# Patient Record
Sex: Male | Born: 1965 | Race: White | Hispanic: No | Marital: Single | Smoking: Never smoker
Health system: Southern US, Community
[De-identification: ages and names within clinical notes are randomized; demographics above are authoritative.]

## PROBLEM LIST (undated history)

## (undated) DIAGNOSIS — I10 Essential (primary) hypertension: Secondary | ICD-10-CM

## (undated) HISTORY — PX: FACIAL FRACTURE SURGERY: SHX1570

## (undated) HISTORY — PX: OTHER SURGICAL HISTORY: SHX169

## (undated) HISTORY — PX: HERNIA REPAIR: SHX51

---

## 2018-07-13 ENCOUNTER — Other Ambulatory Visit: Payer: Self-pay

## 2018-07-13 ENCOUNTER — Emergency Department: Payer: Self-pay

## 2018-07-13 ENCOUNTER — Encounter: Payer: Self-pay | Admitting: Emergency Medicine

## 2018-07-13 ENCOUNTER — Inpatient Hospital Stay
Admission: EM | Admit: 2018-07-13 | Discharge: 2018-07-16 | DRG: 872 | Disposition: A | Discharge status: Home / Self Care | Payer: Self-pay | Attending: Internal Medicine | Admitting: Internal Medicine

## 2018-07-13 DIAGNOSIS — Z981 Arthrodesis status: Secondary | ICD-10-CM

## 2018-07-13 DIAGNOSIS — A419 Sepsis, unspecified organism: Principal | ICD-10-CM

## 2018-07-13 DIAGNOSIS — K219 Gastro-esophageal reflux disease without esophagitis: Secondary | ICD-10-CM | POA: Diagnosis present

## 2018-07-13 DIAGNOSIS — J209 Acute bronchitis, unspecified: Secondary | ICD-10-CM | POA: Diagnosis present

## 2018-07-13 DIAGNOSIS — I10 Essential (primary) hypertension: Secondary | ICD-10-CM | POA: Diagnosis present

## 2018-07-13 DIAGNOSIS — T368X5A Adverse effect of other systemic antibiotics, initial encounter: Secondary | ICD-10-CM | POA: Diagnosis not present

## 2018-07-13 DIAGNOSIS — L509 Urticaria, unspecified: Secondary | ICD-10-CM | POA: Diagnosis not present

## 2018-07-13 DIAGNOSIS — Z79899 Other long term (current) drug therapy: Secondary | ICD-10-CM

## 2018-07-13 DIAGNOSIS — K5792 Diverticulitis of intestine, part unspecified, without perforation or abscess without bleeding: Secondary | ICD-10-CM | POA: Diagnosis present

## 2018-07-13 DIAGNOSIS — Z880 Allergy status to penicillin: Secondary | ICD-10-CM

## 2018-07-13 DIAGNOSIS — Z881 Allergy status to other antibiotic agents status: Secondary | ICD-10-CM

## 2018-07-13 HISTORY — DX: Essential (primary) hypertension: I10

## 2018-07-13 LAB — URINALYSIS, COMPLETE (UACMP) WITH MICROSCOPIC
BACTERIA UA: NONE SEEN
Bilirubin Urine: NEGATIVE
Glucose, UA: NEGATIVE mg/dL
Hgb urine dipstick: NEGATIVE
KETONES UR: NEGATIVE mg/dL
LEUKOCYTES UA: NEGATIVE
Nitrite: NEGATIVE
PH: 7 (ref 5.0–8.0)
Protein, ur: 100 mg/dL — AB
SPECIFIC GRAVITY, URINE: 1.02 (ref 1.005–1.030)

## 2018-07-13 LAB — CBC
HEMATOCRIT: 33.5 % — AB (ref 40.0–52.0)
HEMOGLOBIN: 11.1 g/dL — AB (ref 13.0–18.0)
MCH: 25.4 pg — AB (ref 26.0–34.0)
MCHC: 33.1 g/dL (ref 32.0–36.0)
MCV: 76.7 fL — AB (ref 80.0–100.0)
Platelets: 306 10*3/uL (ref 150–440)
RBC: 4.36 MIL/uL — AB (ref 4.40–5.90)
RDW: 20.9 % — ABNORMAL HIGH (ref 11.5–14.5)
WBC: 9 10*3/uL (ref 3.8–10.6)

## 2018-07-13 LAB — BASIC METABOLIC PANEL
ANION GAP: 9 (ref 5–15)
BUN: 18 mg/dL (ref 6–20)
CHLORIDE: 102 mmol/L (ref 98–111)
CO2: 25 mmol/L (ref 22–32)
Calcium: 9.1 mg/dL (ref 8.9–10.3)
Creatinine, Ser: 1.25 mg/dL — ABNORMAL HIGH (ref 0.61–1.24)
GFR calc non Af Amer: >60 mL/min (ref >60)
GLUCOSE: 126 mg/dL — AB (ref 70–99)
POTASSIUM: 3.5 mmol/L (ref 3.5–5.1)
Sodium: 136 mmol/L (ref 135–145)

## 2018-07-13 LAB — HEPATIC FUNCTION PANEL
ALBUMIN: 4.5 g/dL (ref 3.5–5.0)
ALT: 24 U/L (ref 0–44)
AST: 31 U/L (ref 15–41)
Alkaline Phosphatase: 58 U/L (ref 38–126)
BILIRUBIN TOTAL: 0.6 mg/dL (ref 0.3–1.2)
Bilirubin, Direct: <0.1 mg/dL (ref 0.0–0.2)
Total Protein: 7.6 g/dL (ref 6.5–8.1)

## 2018-07-13 LAB — LACTIC ACID, PLASMA: Lactic Acid, Venous: 1.3 mmol/L (ref 0.5–1.9)

## 2018-07-13 LAB — TROPONIN I: Troponin I: <0.03 ng/mL (ref <0.03)

## 2018-07-13 LAB — LIPASE, BLOOD: Lipase: 23 U/L (ref 11–51)

## 2018-07-13 MED ORDER — MORPHINE SULFATE (PF) 2 MG/ML IV SOLN
2.0000 mg | Freq: Once | INTRAVENOUS | Status: AC
  Administered 2018-07-13: 2 mg via INTRAVENOUS
  Filled 2018-07-13: qty 1

## 2018-07-13 MED ORDER — SODIUM CHLORIDE 0.9 % IV BOLUS
1000.0000 mL | Freq: Once | INTRAVENOUS | Status: AC
  Administered 2018-07-13: 1000 mL via INTRAVENOUS

## 2018-07-13 MED ORDER — ONDANSETRON HCL 4 MG/2ML IJ SOLN
4.0000 mg | Freq: Once | INTRAMUSCULAR | Status: AC
  Administered 2018-07-13: 4 mg via INTRAVENOUS
  Filled 2018-07-13: qty 2

## 2018-07-13 MED ORDER — IOPAMIDOL (ISOVUE-300) INJECTION 61%
30.0000 mL | Freq: Once | INTRAVENOUS | Status: AC
  Administered 2018-07-13: 30 mL via ORAL

## 2018-07-13 MED ORDER — SODIUM CHLORIDE 0.9 % IV BOLUS
500.0000 mL | Freq: Once | INTRAVENOUS | Status: AC
  Administered 2018-07-14: 500 mL via INTRAVENOUS

## 2018-07-13 MED ORDER — LEVOFLOXACIN IN D5W 750 MG/150ML IV SOLN
750.0000 mg | Freq: Once | INTRAVENOUS | Status: AC
  Administered 2018-07-13: 750 mg via INTRAVENOUS
  Filled 2018-07-13: qty 150

## 2018-07-13 MED ORDER — IOHEXOL 300 MG/ML  SOLN
100.0000 mL | Freq: Once | INTRAMUSCULAR | Status: AC | PRN
  Administered 2018-07-13: 100 mL via INTRAVENOUS

## 2018-07-13 MED ORDER — METRONIDAZOLE IN NACL 5-0.79 MG/ML-% IV SOLN: 500.0000 mg | Freq: Once | INTRAVENOUS | Status: DC

## 2018-07-13 NOTE — ED Notes (Signed)
Patient taken to CT scan.

## 2018-07-13 NOTE — H&P (Signed)
[facility] [place] NAME: Keith Goodman    MR#:  [id number]  DATE OF BIRTH:  10-11-1966  DATE OF ADMISSION:  07/13/2018  PRIMARY CARE PHYSICIAN: System, Pcp Not In   REQUESTING/REFERRING PHYSICIAN: Jacqualine Code, MD  CHIEF COMPLAINT:   Chief Complaint  Patient presents with  . Chest Pain  . Abdominal Pain    HISTORY OF PRESENT ILLNESS:  Keith Goodman  is a 52 y.o. male who presents with abdominal pain, fever and chills.  Work-up in the ED with CT imaging shows diverticulitis.  He denies any nausea or vomiting, or diarrhea.  Patient met sepsis criteria.  Hospitalist were called for admission  PAST MEDICAL HISTORY:   Past Medical History:  Diagnosis Date  . Hypertension      PAST SURGICAL HISTORY:   Past Surgical History:  Procedure Laterality Date  . FACIAL FRACTURE SURGERY    . HERNIA REPAIR    . neck fusion       SOCIAL HISTORY:   Social History   Tobacco Use  . Smoking status: Never Smoker  . Smokeless tobacco: Never Used  Substance Use Topics  . Alcohol use: Yes    Comment: occasionally     FAMILY HISTORY:  Family history reviewed and is non-contributory   DRUG ALLERGIES:   Allergies  Allergen Reactions  . Penicillins Palpitations    Has patient had a PCN reaction causing immediate rash, facial/tongue/throat swelling, SOB or lightheadedness with hypotension: Yes Has patient had a PCN reaction causing severe rash involving mucus membranes or skin necrosis: No Has patient had a PCN reaction that required hospitalization: No Has patient had a PCN reaction occurring within the last 10 years: Yes If all of the above answers are "NO", then may proceed with Cephalosporin use.    MEDICATIONS AT HOME:   Prior to Admission medications   Medication Sig Start Date End Date Taking? Authorizing Provider  acidophilus (RISAQUAD) CAPS capsule Take 1 capsule by mouth daily.   Yes [provider]   amLODipine (NORVASC) 2.5 MG tablet Take 2.5 mg by mouth daily.   Yes [provider]  fexofenadine (ALLEGRA) 180 MG tablet Take 180 mg by mouth daily.   Yes [provider]  GUAIFENESIN 1200 PO Take 1 tablet by mouth daily.   Yes [provider]  lansoprazole (PREVACID) 15 MG capsule Take 15 mg by mouth daily at 12 noon.   Yes [provider]  PARoxetine (PAXIL) 40 MG tablet Take 40 mg by mouth every morning.   Yes [provider]  ranitidine (ZANTAC) 150 MG tablet Take 150 mg by mouth 2 (two) times daily.   Yes [provider]    REVIEW OF SYSTEMS:  Review of Systems  Constitutional: Positive for chills and fever. Negative for malaise/fatigue and weight loss.  HENT: Negative for ear pain, hearing loss and tinnitus.   Eyes: Negative for blurred vision, double vision, pain and redness.  Respiratory: Negative for cough, hemoptysis and shortness of breath.   Cardiovascular: Negative for chest pain, palpitations, orthopnea and leg swelling.  Gastrointestinal: Positive for abdominal pain. Negative for constipation, diarrhea, nausea and vomiting.  Genitourinary: Negative for dysuria, frequency and hematuria.  Musculoskeletal: Negative for back pain, joint pain and neck pain.  Skin:       No acne, rash, or lesions  Neurological: Negative for dizziness, tremors, focal weakness and weakness.  Endo/Heme/Allergies: Negative for polydipsia. Does not bruise/bleed easily.  Psychiatric/Behavioral: Negative for  depression. The patient is not nervous/anxious and does not have insomnia.      VITAL SIGNS:   Vitals:   07/13/18 2114 07/13/18 2118 07/13/18 2232  BP:  (!) 157/96 (!) 154/96  Pulse:  (!) 124 95  Resp:  18 18  Temp:  100.3 F (37.9 C) 99.9 F (37.7 C)  TempSrc:  Oral Oral  SpO2:  97% 100%  Weight: 90.7 kg (200 lb)    Height: 5' 7"  (1.702 m)     Wt Readings from Last 3 Encounters:  07/13/18 90.7 kg (200 lb)    PHYSICAL  EXAMINATION:  Physical Exam  Vitals reviewed. Constitutional: He is oriented to person, place, and time. He appears well-developed and well-nourished. No distress.  HENT:  Head: Normocephalic and atraumatic.  Mouth/Throat: Oropharynx is clear and moist.  Eyes: Pupils are equal, round, and reactive to light. Conjunctivae and EOM are normal. No scleral icterus.  Neck: Normal range of motion. Neck supple. No JVD present. No thyromegaly present.  Cardiovascular: Normal rate, regular rhythm and intact distal pulses. Exam reveals no gallop and no friction rub.  No murmur heard. Respiratory: Effort normal and breath sounds normal. No respiratory distress. He has no wheezes. He has no rales.  GI: Soft. Bowel sounds are normal. He exhibits no distension. There is tenderness.  Musculoskeletal: Normal range of motion. He exhibits no edema.  No arthritis, no gout  Lymphadenopathy:    He has no cervical adenopathy.  Neurological: He is alert and oriented to person, place, and time. No cranial nerve deficit.  No dysarthria, no aphasia  Skin: Skin is warm and dry. No rash noted. No erythema.  Psychiatric: He has a normal mood and affect. His behavior is normal. Judgment and thought content normal.    LABORATORY PANEL:   CBC Recent Labs  Lab 07/13/18 2135  WBC 9.0  HGB 11.1*  HCT 33.5*  PLT 306   ------------------------------------------------------------------------------------------------------------------  Chemistries  Recent Labs  Lab 07/13/18 2135  NA 136  K 3.5  CL 102  CO2 25  GLUCOSE 126*  BUN 18  CREATININE 1.25*  CALCIUM 9.1  AST 31  ALT 24  ALKPHOS 58  BILITOT 0.6   ------------------------------------------------------------------------------------------------------------------  Cardiac Enzymes Recent Labs  Lab 07/13/18 2135  TROPONINI <0.03    ------------------------------------------------------------------------------------------------------------------  RADIOLOGY:  Dg Chest 2 View  Result Date: 07/13/2018 CLINICAL DATA:  Chest pain EXAM: CHEST - 2 VIEW COMPARISON:  None. FINDINGS: The heart size and mediastinal contours are within normal limits. Both lungs are clear. The visualized skeletal structures are unremarkable. IMPRESSION: No active cardiopulmonary disease. Electronically Signed   By: Ulyses Jarred M.D.   On: 07/13/2018 22:54   Ct Abdomen Pelvis W Contrast  Result Date: 07/13/2018 CLINICAL DATA:  Left upper quadrant and chest pain radiating to the back since Saturday morning. History of inguinal hernia repair. EXAM: CT ABDOMEN AND PELVIS WITH CONTRAST TECHNIQUE: Multidetector CT imaging of the abdomen and pelvis was performed using the standard protocol following bolus administration of intravenous contrast. CONTRAST:  11m OMNIPAQUE IOHEXOL 300 MG/ML  SOLN COMPARISON:  None. FINDINGS: Lower chest: No acute abnormality. Hepatobiliary: A few scattered subcentimeter hypodensities are noted within the liver too small to further characterize but statistically consistent with cysts or hemangiomata. No biliary dilatation or enhancing lesion. Gallbladder is physiologically distended. No calculi are noted within. Pancreas: No pancreatic inflammation, mass or ductal dilatation. Mild fatty atrophy. Spleen: No splenomegaly or mass. Adrenals/Urinary Tract: Normal bilateral adrenal glands. Nonobstructing calculi in  the upper pole of the right kidney measuring up to 3 mm. No enhancing mass lesions of either kidney. No hydroureteronephrosis nor ureteral calculi. The urinary bladder is unremarkable for the degree of distention. Stomach/Bowel: Short segmental mural thickening and pericolonic inflammation along the mid transverse colon. Findings could represent a mild diverticulitis or focal colitis. Interval follow-up after appropriate therapy is  recommended to assure resolution and to exclude other etiologies including neoplasm. No bowel obstruction or inflammation. Contrast distended stomach without focal mural thickening. Normal appendix. Vascular/Lymphatic: Mild aortoiliac atherosclerosis.  No adenopathy. Reproductive: Normal size prostate and seminal vesicles. Other: Herniorrhaphy change bilaterally. No recurrent hernia identified. 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 IMPRESSION: 1. Short segmental mural thickening of the mid transverse colon with pericolonic inflammation. Findings likely reflect stigmata of uncomplicated diverticulitis or short segment colitis. Follow-up to assure resolution is recommended. 2. A few scattered subcentimeter hypodensities are noted within the liver too small to further characterize but statistically consistent with cysts or hemangiomata. 3. 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 Electronically Signed   By: Ashley Royalty M.D.   On: 07/13/2018 23:17    EKG:   Orders placed or performed during the hospital encounter of 07/13/18  . EKG 12-Lead  . EKG 12-Lead  . ED EKG within 10 minutes  . ED EKG within 10 minutes    IMPRESSION AND PLAN:  Principal Problem:   Sepsis ([place]) -IV antibiotics, lactic acid within normal limits, blood pressure stable, cultures sent, will switch patient to meropenem as he had hives in reaction to levofloxacin Active Problems:   Diverticulitis -source of sepsis, treatment as above   GERD (gastroesophageal reflux disease) -home dose PPI   HTN (hypertension) -home dose antihypertensive  Chart review performed and case discussed with ED provider. Labs, imaging and/or ECG reviewed by provider and discussed with patient/family. Management plans discussed with the patient and/or family.  DVT PROPHYLAXIS: SubQ lovenox  GI PROPHYLAXIS: PPI  ADMISSION STATUS: Inpatient  CODE STATUS: Full  TOTAL TIME  TAKING CARE OF THIS PATIENT: 45 minutes.   Jahne Krukowski [place] 07/13/2018, 11:56 PM  [facility]  Office  [phone]  CC: Primary care physician; System, Pcp Not In  Note:  This document was prepared using Dragon voice recognition software and may include unintentional dictation errors.

## 2018-07-13 NOTE — ED Triage Notes (Signed)
Pt arrives ambulatory to triage with c/o LUQ pain and chest pain which radiates around to his back. [place] and has been experiencing pain since Saturday morning. Pt is clearly uncomfortable at this time.

## 2018-07-14 ENCOUNTER — Other Ambulatory Visit: Payer: Self-pay

## 2018-07-14 LAB — BASIC METABOLIC PANEL
Anion gap: 7 (ref 5–15)
BUN: 11 mg/dL (ref 6–20)
CALCIUM: 8.5 mg/dL — AB (ref 8.9–10.3)
CO2: 25 mmol/L (ref 22–32)
CREATININE: 1.05 mg/dL (ref 0.61–1.24)
Chloride: 105 mmol/L (ref 98–111)
GLUCOSE: 120 mg/dL — AB (ref 70–99)
Potassium: 3.7 mmol/L (ref 3.5–5.1)
SODIUM: 137 mmol/L (ref 135–145)

## 2018-07-14 LAB — CBC
HCT: 31.5 % — ABNORMAL LOW (ref 40.0–52.0)
Hemoglobin: 10.4 g/dL — ABNORMAL LOW (ref 13.0–18.0)
MCH: 25.4 pg — AB (ref 26.0–34.0)
MCHC: 32.9 g/dL (ref 32.0–36.0)
MCV: 77.1 fL — ABNORMAL LOW (ref 80.0–100.0)
PLATELETS: 253 10*3/uL (ref 150–440)
RBC: 4.08 MIL/uL — ABNORMAL LOW (ref 4.40–5.90)
RDW: 20.7 % — AB (ref 11.5–14.5)
WBC: 8.6 10*3/uL (ref 3.8–10.6)

## 2018-07-14 MED ORDER — ONDANSETRON HCL 4 MG PO TABS
4.0000 mg | ORAL_TABLET | Freq: Four times a day (QID) | ORAL | Status: DC | PRN
  Administered 2018-07-14: 03:00:00 4 mg via ORAL
  Filled 2018-07-14: qty 1

## 2018-07-14 MED ORDER — DOCUSATE SODIUM 100 MG PO CAPS: 100.0000 mg | ORAL_CAPSULE | Freq: Two times a day (BID) | ORAL | Status: DC | PRN

## 2018-07-14 MED ORDER — PAROXETINE HCL 20 MG PO TABS
40.0000 mg | ORAL_TABLET | ORAL | Status: DC
  Administered 2018-07-14 – 2018-07-16 (×3): 40 mg via ORAL
  Filled 2018-07-14 (×4): qty 2

## 2018-07-14 MED ORDER — AMLODIPINE BESYLATE 5 MG PO TABS
2.5000 mg | ORAL_TABLET | Freq: Every day | ORAL | Status: DC
  Administered 2018-07-14 – 2018-07-15 (×2): 2.5 mg via ORAL
  Filled 2018-07-14 (×2): qty 1

## 2018-07-14 MED ORDER — DIPHENHYDRAMINE HCL 50 MG/ML IJ SOLN
25.0000 mg | Freq: Once | INTRAMUSCULAR | Status: AC
  Administered 2018-07-14: 02:00:00 25 mg via INTRAVENOUS
  Filled 2018-07-14: qty 0.5

## 2018-07-14 MED ORDER — METRONIDAZOLE IN NACL 5-0.79 MG/ML-% IV SOLN
500.0000 mg | Freq: Three times a day (TID) | INTRAVENOUS | Status: DC
  Administered 2018-07-14 – 2018-07-16 (×6): 500 mg via INTRAVENOUS
  Filled 2018-07-14 (×7): qty 100

## 2018-07-14 MED ORDER — SODIUM CHLORIDE 0.9 % IV SOLN
1.0000 g | Freq: Three times a day (TID) | INTRAVENOUS | Status: DC
  Filled 2018-07-14 (×2): qty 1

## 2018-07-14 MED ORDER — ENOXAPARIN SODIUM 40 MG/0.4ML ~~LOC~~ SOLN
40.0000 mg | SUBCUTANEOUS | Status: DC
  Administered 2018-07-14 – 2018-07-15 (×2): 40 mg via SUBCUTANEOUS
  Filled 2018-07-14 (×3): qty 0.4

## 2018-07-14 MED ORDER — ONDANSETRON HCL 4 MG/2ML IJ SOLN: 4.0000 mg | Freq: Four times a day (QID) | INTRAMUSCULAR | Status: DC | PRN

## 2018-07-14 MED ORDER — MORPHINE SULFATE (PF) 4 MG/ML IV SOLN
4.0000 mg | Freq: Once | INTRAVENOUS | Status: AC
  Administered 2018-07-14: 04:00:00 4 mg via INTRAVENOUS
  Filled 2018-07-14: qty 1

## 2018-07-14 MED ORDER — PANTOPRAZOLE SODIUM 20 MG PO TBEC
20.0000 mg | DELAYED_RELEASE_TABLET | Freq: Every day | ORAL | Status: DC
  Administered 2018-07-14 – 2018-07-16 (×3): 20 mg via ORAL
  Filled 2018-07-14 (×3): qty 1

## 2018-07-14 MED ORDER — OXYCODONE-ACETAMINOPHEN 5-325 MG PO TABS
1.0000 | ORAL_TABLET | Freq: Four times a day (QID) | ORAL | Status: DC | PRN
  Administered 2018-07-15: 1 via ORAL
  Filled 2018-07-14: qty 1

## 2018-07-14 MED ORDER — FAMOTIDINE 20 MG PO TABS
20.0000 mg | ORAL_TABLET | Freq: Two times a day (BID) | ORAL | Status: DC
  Administered 2018-07-14 – 2018-07-16 (×5): 20 mg via ORAL
  Filled 2018-07-14 (×5): qty 1

## 2018-07-14 MED ORDER — ACETAMINOPHEN 325 MG PO TABS
650.0000 mg | ORAL_TABLET | Freq: Four times a day (QID) | ORAL | Status: DC | PRN
  Administered 2018-07-14: 12:00:00 650 mg via ORAL
  Filled 2018-07-14 (×3): qty 2

## 2018-07-14 MED ORDER — MELATONIN 5 MG PO TABS
10.0000 mg | ORAL_TABLET | Freq: Every day | ORAL | Status: DC
  Administered 2018-07-14 – 2018-07-15 (×2): 10 mg via ORAL
  Filled 2018-07-14 (×4): qty 2

## 2018-07-14 MED ORDER — SODIUM CHLORIDE 0.9 % IV SOLN
2.0000 g | INTRAVENOUS | Status: DC
  Administered 2018-07-14 – 2018-07-15 (×2): 2 g via INTRAVENOUS
  Filled 2018-07-14 (×2): qty 2
  Filled 2018-07-14: qty 20

## 2018-07-14 MED ORDER — ACETAMINOPHEN 650 MG RE SUPP: 650.0000 mg | Freq: Four times a day (QID) | RECTAL | Status: DC | PRN

## 2018-07-14 MED ORDER — SODIUM CHLORIDE 0.9 % IV SOLN
1.0000 g | Freq: Three times a day (TID) | INTRAVENOUS | Status: DC
  Administered 2018-07-14 (×2): 1 g via INTRAVENOUS
  Filled 2018-07-14 (×4): qty 1

## 2018-07-14 NOTE — Progress Notes (Addendum)
ANTIBIOTIC CONSULT NOTE - INITIAL  Pharmacy Consult for Meropenem  Indication: intra-abdominal infection  Allergies  Allergen Reactions  . Levofloxacin Hives  . Penicillins Palpitations    Has patient had a PCN reaction causing immediate rash, facial/tongue/throat swelling, SOB or lightheadedness with hypotension: Yes Has patient had a PCN reaction causing severe rash involving mucus membranes or skin necrosis: No Has patient had a PCN reaction that required hospitalization: No Has patient had a PCN reaction occurring within the last 10 years: Yes If all of the above answers are "NO", then may proceed with Cephalosporin use.    Patient Measurements: Height: 5\' 7"  (170.2 cm) Weight: 200 lb (90.7 kg) IBW/kg (Calculated) : 66.1 Adjusted Body Weight:   Vital Signs: Temp: 100.5 F (38.1 C) (07/15 0119) Temp Source: Oral (07/15 0119) BP: 146/100 (07/15 0119) Pulse Rate: 100 (07/15 0119) Intake/Output from previous day: No intake/output data recorded. Intake/Output from this shift: No intake/output data recorded.  Labs: Recent Labs    07/13/18 2135  WBC 9.0  HGB 11.1*  PLT 306  CREATININE 1.25*   Estimated Creatinine Clearance: 75.9 mL/min (A) (by C-G formula based on SCr of 1.25 mg/dL (H)). No results for input(s): VANCOTROUGH, VANCOPEAK, VANCORANDOM, GENTTROUGH, GENTPEAK, GENTRANDOM, TOBRATROUGH, TOBRAPEAK, TOBRARND, AMIKACINPEAK, AMIKACINTROU, AMIKACIN in the last 72 hours.   Microbiology: No results found for this or any previous visit (from the past 720 hour(s)).  Medical History: Past Medical History:  Diagnosis Date  . Hypertension     Medications:  Medications Prior to Admission  Medication Sig Dispense Refill Last Dose  . acidophilus (RISAQUAD) CAPS capsule Take 1 capsule by mouth daily.   07/13/2018 at Unknown time  . amLODipine (NORVASC) 2.5 MG tablet Take 2.5 mg by mouth daily.   07/13/2018 at Unknown time  . fexofenadine (ALLEGRA) 180 MG tablet Take 180  mg by mouth daily.   07/13/2018 at Unknown time  . GUAIFENESIN 1200 PO Take 1 tablet by mouth daily.   07/13/2018 at Unknown time  . lansoprazole (PREVACID) 15 MG capsule Take 15 mg by mouth daily at 12 noon.   07/13/2018 at Unknown time  . PARoxetine (PAXIL) 40 MG tablet Take 40 mg by mouth every morning.   07/13/2018 at Unknown time  . ranitidine (ZANTAC) 150 MG tablet Take 150 mg by mouth 2 (two) times daily.   07/13/2018 at Unknown time   Assessment: CrCl = 75.9 ml/min  Goal of Therapy:  resolution of infection  Plan:  Expected duration 7 days with resolution of temperature and/or normalization of WBC   Meropenem 1 gm IV Q8H ordered to start on 7/15 @ 0200.   Keenya Matera D 07/14/2018,1:47 AM

## 2018-07-14 NOTE — ED Provider Notes (Signed)
[facility] Emergency Department Provider Note  ____________________________________________   First MD Initiated Contact with Patient 07/13/18 2355     (approximate)  I have reviewed the triage vital signs and the nursing notes.   HISTORY  Chief Complaint Chest Pain and Abdominal Pain    HPI Keith Goodman is a 52 y.o. male here for evaluation of upper abdominal pain  Yesterday in [place] began to experience pain across his upper abdomen, is been worsening over the last day.  Nausea but no vomiting.  Is having fevers and chills, reports the pain is very severe in the mid upper abdomen.  Never had pain like this before.  Has had 2 loose bowel movements a day.  No chest pain or trouble breathing.  Pain is sharp, but always present, much worse when he has a wrapping.  Reports sister with a history of diverticulitis.  No previous abdominal surgeries.  Traveling from [place]    Past Medical History:  Diagnosis Date  . Hypertension     Patient Active Problem List   Diagnosis Date Noted  . Sepsis ([facility]) 07/13/2018  . Diverticulitis 07/13/2018  . HTN (hypertension) 07/13/2018  . GERD (gastroesophageal reflux disease) 07/13/2018    Past Surgical History:  Procedure Laterality Date  . FACIAL FRACTURE SURGERY    . HERNIA REPAIR    . neck fusion      Prior to Admission medications   Medication Sig Start Date End Date Taking? Authorizing Provider  acidophilus (RISAQUAD) CAPS capsule Take 1 capsule by mouth daily.   Yes [provider]  amLODipine (NORVASC) 2.5 MG tablet Take 2.5 mg by mouth daily.   Yes [provider]  fexofenadine (ALLEGRA) 180 MG tablet Take 180 mg by mouth daily.   Yes [provider]  GUAIFENESIN 1200 PO Take 1 tablet by mouth daily.   Yes [provider]  lansoprazole (PREVACID) 15 MG capsule Take 15 mg by mouth daily at 12 noon.   Yes [provider]  PARoxetine (PAXIL) 40 MG  tablet Take 40 mg by mouth every morning.   Yes [provider]  ranitidine (ZANTAC) 150 MG tablet Take 150 mg by mouth 2 (two) times daily.   Yes [provider]    Allergies Penicillins  No family history on file.  Social History Social History   Tobacco Use  . Smoking status: Never Smoker  . Smokeless tobacco: Never Used  Substance Use Topics  . Alcohol use: Yes    Comment: occasionally  . Drug use: Never    Review of Systems Constitutional: No fever/chills Eyes: No visual changes. ENT: No sore throat. Cardiovascular: Denies chest pain. Respiratory: Denies shortness of breath. Gastrointestinal:  No constipation. Genitourinary: Negative for dysuria. Musculoskeletal: Negative for back pain. Skin: Negative for rash. Neurological: Negative for headaches.    ____________________________________________   PHYSICAL EXAM:  VITAL SIGNS: ED Triage Vitals  Enc Vitals Group     BP 07/13/18 2118 (!) 157/96     Pulse Rate 07/13/18 2118 (!) 124     Resp 07/13/18 2118 18     Temp 07/13/18 2118 100.3 F (37.9 C)     Temp Source 07/13/18 2118 Oral     SpO2 07/13/18 2118 97 %     Weight 07/13/18 2114 200 lb (90.7 kg)     Height 07/13/18 2114 5\' 7"  (1.702 m)     Head Circumference --      Peak Flow --  Pain Score 07/13/18 2114 10     Pain Loc --      Pain Edu? --      Excl. in GC? --     Constitutional: Alert and oriented.  Is very pleasant.  Appears moderately ill with tachycardia appears slightly diaphoretic.  Reports he is a strong [place]. Eyes: Conjunctivae are normal. Head: Atraumatic. Nose: No congestion/rhinnorhea. Mouth/Throat: Mucous membranes are slightly dry. Neck: No stridor.   Cardiovascular: Tachycardic rate, regular rhythm. Grossly normal heart sounds.  Good peripheral circulation. Respiratory: Normal respiratory effort.  No retractions. Lungs CTAB. Gastrointestinal: Soft and exquisitely tender across the epigastrium with  peritonitis in the epigastric region.  No abdominal pain in the lower abdomen.. No distention. Musculoskeletal: No lower extremity tenderness nor edema. Neurologic:  Normal speech and language. No gross focal neurologic deficits are appreciated.  Skin:  Skin is warm, dry and intact. No rash noted. Psychiatric: Mood and affect are normal. Speech and behavior are normal.  ____________________________________________   LABS (all labs ordered are listed, but only abnormal results are displayed)  Labs Reviewed  BASIC METABOLIC PANEL - Abnormal; Notable for the following components:      Result Value   Glucose, Bld 126 (*)    Creatinine, Ser 1.25 (*)    All other components within normal limits  CBC - Abnormal; Notable for the following components:   RBC 4.36 (*)    Hemoglobin 11.1 (*)    HCT 33.5 (*)    MCV 76.7 (*)    MCH 25.4 (*)    RDW 20.9 (*)    All other components within normal limits  URINALYSIS, COMPLETE (UACMP) WITH MICROSCOPIC - Abnormal; Notable for the following components:   Color, Urine YELLOW (*)    APPearance CLEAR (*)    Protein, ur 100 (*)    All other components within normal limits  CULTURE, BLOOD (ROUTINE X 2)  CULTURE, BLOOD (ROUTINE X 2)  TROPONIN I  LIPASE, BLOOD  HEPATIC FUNCTION PANEL  LACTIC ACID, PLASMA  LACTIC ACID, PLASMA   ____________________________________________  EKG   ____________________________________________  RADIOLOGY  Dg Chest 2 View  Result Date: 07/13/2018 CLINICAL DATA:  Chest pain EXAM: CHEST - 2 VIEW COMPARISON:  None. FINDINGS: The heart size and mediastinal contours are within normal limits. Both lungs are clear. The visualized skeletal structures are unremarkable. IMPRESSION: No active cardiopulmonary disease. Electronically Signed   By: Deatra RobinsonKevin  Herman M.D.   On: 07/13/2018 22:54   Ct Abdomen Pelvis W Contrast  Result Date: 07/13/2018 CLINICAL DATA:  Left upper quadrant and chest pain radiating to the back since  Saturday morning. History of inguinal hernia repair. EXAM: CT ABDOMEN AND PELVIS WITH CONTRAST TECHNIQUE: Multidetector CT imaging of the abdomen and pelvis was performed using the standard protocol following bolus administration of intravenous contrast. CONTRAST:  100mL OMNIPAQUE IOHEXOL 300 MG/ML  SOLN COMPARISON:  None. FINDINGS: Lower chest: No acute abnormality. Hepatobiliary: A few scattered subcentimeter hypodensities are noted within the liver too small to further characterize but statistically consistent with cysts or hemangiomata. No biliary dilatation or enhancing lesion. Gallbladder is physiologically distended. No calculi are noted within. Pancreas: No pancreatic inflammation, mass or ductal dilatation. Mild fatty atrophy. Spleen: No splenomegaly or mass. Adrenals/Urinary Tract: Normal bilateral adrenal glands. Nonobstructing calculi in the upper pole of the right kidney measuring up to 3 mm. No enhancing mass lesions of either kidney. No hydroureteronephrosis nor ureteral calculi. The urinary bladder is unremarkable for the degree of distention. Stomach/Bowel: Short  segmental mural thickening and pericolonic inflammation along the mid transverse colon. Findings could represent a mild diverticulitis or focal colitis. Interval follow-up after appropriate therapy is recommended to assure resolution and to exclude other etiologies including neoplasm. No bowel obstruction or inflammation. Contrast distended stomach without focal mural thickening. Normal appendix. Vascular/Lymphatic: Mild aortoiliac atherosclerosis.  No adenopathy. Reproductive: Normal size prostate and seminal vesicles. Other: Herniorrhaphy change bilaterally. No recurrent hernia identified. 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 IMPRESSION: 1. Short segmental mural thickening of the mid transverse colon with pericolonic inflammation. Findings likely reflect stigmata of uncomplicated  diverticulitis or short segment colitis. Follow-up to assure resolution is recommended. 2. A few scattered subcentimeter hypodensities are noted within the liver too small to further characterize but statistically consistent with cysts or hemangiomata. 3. 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 Electronically Signed   By: Tollie Eth M.D.   On: 07/13/2018 23:17    Reviewed findings including incidental findings with patient at the bedside.  CT scan reviewed by me, notable for probable uncomplicated diverticulitis or colitis. ____________________________________________   PROCEDURES  Procedure(s) performed: None  Procedures  Critical Care performed: No  ____________________________________________   INITIAL IMPRESSION / ASSESSMENT AND PLAN / ED COURSE  Pertinent labs & imaging results that were available during my care of the patient were reviewed by me and considered in my medical decision making (see chart for details).  Differential diagnosis includes but is not limited to, abdominal perforation, aortic dissection, cholecystitis, appendicitis, diverticulitis, colitis, esophagitis/gastritis, kidney stone, pyelonephritis, urinary tract infection, aortic aneurysm. All are considered in decision and treatment plan. Based upon the patient's presentation and risk factors, will see the CT scan given apparent peritonitis in the epigastrium with gastrointestinal symptoms.  Patient CT scan positive for diverticulitis versus colitis, clinical history and peritonitis seem to suggest diverticulitis.  Will initiate broad-spectrum antibiotics as recommended by Cohen sepsis protocol for intra-abdominal infection.  Patient heart rate and vital signs are improving with treatment, and patient reports morphine has helped to control his pain.  Given his tachycardia, diverticulitis, and notable abdominal pain, I am suspicious the patient is developing sepsis.  Will admit for further  observation and care under the hospitalist service, discussed with Dr. Anne Hahn.  Patient agreeable with plan.  Requesting second dose of morphine, he is fully awake and alert will provide.  Stable for admission.      ____________________________________________   FINAL CLINICAL IMPRESSION(S) / ED DIAGNOSES  Final diagnoses:  Diverticulitis  Sepsis, due to unspecified organism [employer])      NEW MEDICATIONS STARTED DURING THIS VISIT:  New Prescriptions   No medications on file     Note:  This document was prepared using Dragon voice recognition software and may include unintentional dictation errors.     Sharyn Creamer, MD 07/14/18 [phone]

## 2018-07-14 NOTE — Progress Notes (Signed)
Chaplain received on OR to complete or update and AD. Patient was asleep, so his wife asked me to follow up at another time.      07/14/18 1700  Clinical Encounter Type  Visited With Family  Visit Type Initial

## 2018-07-14 NOTE — Progress Notes (Signed)
Patient complained of itching at PIV site while antibiotic (levofloxacin) was finishing. Assessed site, welts above PIV site. MD notified. See new orders.

## 2018-07-14 NOTE — Progress Notes (Signed)
Patient assessed for additional reaction to antibiotic. No additional symptoms and welts have subsided.

## 2018-07-14 NOTE — Progress Notes (Signed)
[facility] - [place] at [facility]   PATIENT NAME: Keith Goodman    MR#:  [id number]  DATE OF BIRTH:  Dec 04, 1966  SUBJECTIVE:  CHIEF COMPLAINT:   Chief Complaint  Patient presents with  . Chest Pain  . Abdominal Pain   Still fever 101.6 this morning.  Abdominal pain is better. REVIEW OF SYSTEMS:  Review of Systems  Constitutional: Positive for chills and fever. Negative for malaise/fatigue.  HENT: Negative for sore throat.   Eyes: Negative for blurred vision and double vision.  Respiratory: Negative for cough, hemoptysis, shortness of breath, wheezing and stridor.   Cardiovascular: Negative for chest pain, palpitations, orthopnea and leg swelling.  Gastrointestinal: Positive for abdominal pain. Negative for blood in stool, constipation, diarrhea, melena, nausea and vomiting.  Genitourinary: Negative for dysuria, flank pain and hematuria.  Musculoskeletal: Negative for back pain and joint pain.  Skin: Negative for rash.  Neurological: Negative for dizziness, sensory change, focal weakness, seizures, loss of consciousness, weakness and headaches.  Endo/Heme/Allergies: Negative for polydipsia.  Psychiatric/Behavioral: Negative for depression. The patient is not nervous/anxious.     DRUG ALLERGIES:   Allergies  Allergen Reactions  . Levofloxacin Hives  . Penicillins Palpitations    Has patient had a PCN reaction causing immediate rash, facial/tongue/throat swelling, SOB or lightheadedness with hypotension: Yes Has patient had a PCN reaction causing severe rash involving mucus membranes or skin necrosis: No Has patient had a PCN reaction that required hospitalization: No Has patient had a PCN reaction occurring within the last 10 years: Yes If all of the above answers are "NO", then may proceed with Cephalosporin use.   VITALS:  Blood pressure (!) 145/91, pulse 93, temperature 99.1 F (37.3 C), temperature source Oral, resp. rate 20, height 5\' 7"  (1.702  m), weight 200 lb (90.7 kg), SpO2 95 %. PHYSICAL EXAMINATION:  Physical Exam  Constitutional: He is oriented to person, place, and time. He appears well-developed.  Obesity.  HENT:  Head: Normocephalic.  Mouth/Throat: Oropharynx is clear and moist.  Eyes: Pupils are equal, round, and reactive to light. Conjunctivae and EOM are normal. No scleral icterus.  Neck: Normal range of motion. Neck supple. No JVD present. No tracheal deviation present.  Cardiovascular: Normal rate, regular rhythm and normal heart sounds. Exam reveals no gallop.  No murmur heard. Pulmonary/Chest: Effort normal and breath sounds normal. No respiratory distress. He has no wheezes. He has no rales.  Abdominal: Soft. Bowel sounds are normal. He exhibits no distension. There is tenderness. There is no rebound.  Tenderness on epigastric area and left side  Musculoskeletal: Normal range of motion. He exhibits no edema or tenderness.  Neurological: He is alert and oriented to person, place, and time. No cranial nerve deficit.  Skin: No rash noted. No erythema.   LABORATORY PANEL:  Male CBC Recent Labs  Lab 07/14/18 0523  WBC 8.6  HGB 10.4*  HCT 31.5*  PLT 253   ------------------------------------------------------------------------------------------------------------------ Chemistries  Recent Labs  Lab 07/13/18 2135 07/14/18 0523  NA 136 137  K 3.5 3.7  CL 102 105  CO2 25 25  GLUCOSE 126* 120*  BUN 18 11  CREATININE 1.25* 1.05  CALCIUM 9.1 8.5*  AST 31  --   ALT 24  --   ALKPHOS 58  --   BILITOT 0.6  --    RADIOLOGY:  Dg Chest 2 View  Result Date: 07/13/2018 CLINICAL DATA:  Chest pain EXAM: CHEST - 2 VIEW COMPARISON:  None. FINDINGS: The heart  size and mediastinal contours are within normal limits. Both lungs are clear. The visualized skeletal structures are unremarkable. IMPRESSION: No active cardiopulmonary disease. Electronically Signed   By: Deatra RobinsonKevin  Herman M.D.   On: 07/13/2018 22:54   Ct  Abdomen Pelvis W Contrast  Result Date: 07/13/2018 CLINICAL DATA:  Left upper quadrant and chest pain radiating to the back since Saturday morning. History of inguinal hernia repair. EXAM: CT ABDOMEN AND PELVIS WITH CONTRAST TECHNIQUE: Multidetector CT imaging of the abdomen and pelvis was performed using the standard protocol following bolus administration of intravenous contrast. CONTRAST:  100mL OMNIPAQUE IOHEXOL 300 MG/ML  SOLN COMPARISON:  None. FINDINGS: Lower chest: No acute abnormality. Hepatobiliary: A few scattered subcentimeter hypodensities are noted within the liver too small to further characterize but statistically consistent with cysts or hemangiomata. No biliary dilatation or enhancing lesion. Gallbladder is physiologically distended. No calculi are noted within. Pancreas: No pancreatic inflammation, mass or ductal dilatation. Mild fatty atrophy. Spleen: No splenomegaly or mass. Adrenals/Urinary Tract: Normal bilateral adrenal glands. Nonobstructing calculi in the upper pole of the right kidney measuring up to 3 mm. No enhancing mass lesions of either kidney. No hydroureteronephrosis nor ureteral calculi. The urinary bladder is unremarkable for the degree of distention. Stomach/Bowel: Short segmental mural thickening and pericolonic inflammation along the mid transverse colon. Findings could represent a mild diverticulitis or focal colitis. Interval follow-up after appropriate therapy is recommended to assure resolution and to exclude other etiologies including neoplasm. No bowel obstruction or inflammation. Contrast distended stomach without focal mural thickening. Normal appendix. Vascular/Lymphatic: Mild aortoiliac atherosclerosis.  No adenopathy. Reproductive: Normal size prostate and seminal vesicles. Other: Herniorrhaphy change bilaterally. No recurrent hernia identified. 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 IMPRESSION: 1. Short segmental  mural thickening of the mid transverse colon with pericolonic inflammation. Findings likely reflect stigmata of uncomplicated diverticulitis or short segment colitis. Follow-up to assure resolution is recommended. 2. A few scattered subcentimeter hypodensities are noted within the liver too small to further characterize but statistically consistent with cysts or hemangiomata. 3. 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 Electronically Signed   By: Tollie Ethavid  Kwon M.D.   On: 07/13/2018 23:17   ASSESSMENT AND PLAN:   Sepsis due to acute diverticulitis. Continue Rocephin and Flagyl, follow-up blood culture. Pain control.   GERD.  Continue PPI. HTN.  Continue Norvasc.  All the records are reviewed and case discussed with Care Management/Social Worker. Management plans discussed with the patient, family and they are in agreement.  CODE STATUS: Full Code  TOTAL TIME TAKING CARE OF THIS PATIENT: 28 minutes.   More than 50% of the time was spent in counseling/coordination of care: YES  POSSIBLE D/C IN 2 DAYS, DEPENDING ON CLINICAL CONDITION.   Shaune PollackQing Solace Wendorff M.D on 07/14/2018 at 3:44 PM  Between 7am to 6pm - Pager - [phone]  After 6pm go to [email or web address] - Therapist, nutritionalpassword [email or web address]  [facility] [place] [facility]

## 2018-07-14 NOTE — Progress Notes (Signed)
Discussed patient temperature with him and discussed administering tylenol. Patient refused due to past negative effects when taking tylenol.

## 2018-07-14 NOTE — Plan of Care (Signed)
  Problem: Education: Goal: Knowledge of General Education information will improve Outcome: Progressing   Problem: Elimination: Goal: Will not experience complications related to urinary retention Outcome: Progressing   Problem: Pain Managment: Goal: General experience of comfort will improve Outcome: Progressing   Problem: Safety: Goal: Ability to remain free from injury will improve Outcome: Progressing   Problem: Skin Integrity: Goal: Risk for impaired skin integrity will decrease Outcome: Progressing   

## 2018-07-15 LAB — HIV ANTIBODY (ROUTINE TESTING W REFLEX): HIV SCREEN 4TH GENERATION: NONREACTIVE

## 2018-07-15 MED ORDER — IPRATROPIUM-ALBUTEROL 0.5-2.5 (3) MG/3ML IN SOLN
3.0000 mL | Freq: Four times a day (QID) | RESPIRATORY_TRACT | Status: DC
  Administered 2018-07-15: 3 mL via RESPIRATORY_TRACT
  Filled 2018-07-15: qty 3

## 2018-07-15 MED ORDER — AMLODIPINE BESYLATE 5 MG PO TABS: 5.0000 mg | ORAL_TABLET | Freq: Every day | ORAL | Status: DC

## 2018-07-15 MED ORDER — IPRATROPIUM-ALBUTEROL 0.5-2.5 (3) MG/3ML IN SOLN
3.0000 mL | Freq: Three times a day (TID) | RESPIRATORY_TRACT | Status: DC
  Administered 2018-07-15: 20:00:00 3 mL via RESPIRATORY_TRACT
  Filled 2018-07-15: qty 3

## 2018-07-15 MED ORDER — GUAIFENESIN-DM 100-10 MG/5ML PO SYRP
5.0000 mL | ORAL_SOLUTION | ORAL | Status: DC | PRN
  Administered 2018-07-15 (×2): 5 mL via ORAL
  Filled 2018-07-15 (×3): qty 5

## 2018-07-15 MED ORDER — HYDRALAZINE HCL 20 MG/ML IJ SOLN: 10.0000 mg | Freq: Four times a day (QID) | INTRAMUSCULAR | Status: DC | PRN

## 2018-07-15 NOTE — Plan of Care (Signed)
In am pt had fever 100.9. Temp decreased to 99.1 with no intervention. VSS. Pt denies pain/n/v. Abdomen slightly sore per pt. 2xloose stools during the shift per pt. Pt cough and wheezing, Duoneb qx6hrs initiated. ABX continues.

## 2018-07-15 NOTE — Progress Notes (Signed)
[facility] - [place] at [facility]   PATIENT NAME: Keith NickelRichard Clingan    MR#:  [id number]  DATE OF BIRTH:  06/20/1966  SUBJECTIVE:  CHIEF COMPLAINT:   Chief Complaint  Patient presents with  . Chest Pain  . Abdominal Pain   Still fever 100.9 this morning.  Cough and wheezing.  Abdominal pain is better. REVIEW OF SYSTEMS:  Review of Systems  Constitutional: Positive for chills and fever. Negative for malaise/fatigue.  HENT: Negative for sore throat.   Eyes: Negative for blurred vision and double vision.  Respiratory: Positive for cough, shortness of breath and wheezing. Negative for hemoptysis, sputum production and stridor.   Cardiovascular: Negative for chest pain, palpitations, orthopnea and leg swelling.  Gastrointestinal: Positive for abdominal pain. Negative for blood in stool, constipation, diarrhea, melena, nausea and vomiting.  Genitourinary: Negative for dysuria, flank pain and hematuria.  Musculoskeletal: Negative for back pain and joint pain.  Skin: Negative for rash.  Neurological: Negative for dizziness, sensory change, focal weakness, seizures, loss of consciousness, weakness and headaches.  Endo/Heme/Allergies: Negative for polydipsia.  Psychiatric/Behavioral: Negative for depression. The patient is not nervous/anxious.     DRUG ALLERGIES:   Allergies  Allergen Reactions  . Levofloxacin Hives  . Penicillins Palpitations    Has patient had a PCN reaction causing immediate rash, facial/tongue/throat swelling, SOB or lightheadedness with hypotension: Yes Has patient had a PCN reaction causing severe rash involving mucus membranes or skin necrosis: No Has patient had a PCN reaction that required hospitalization: No Has patient had a PCN reaction occurring within the last 10 years: Yes If all of the above answers are "NO", then may proceed with Cephalosporin use.   VITALS:  Blood pressure (!) 162/100, pulse 90, temperature 99.1 F (37.3 C),  temperature source Oral, resp. rate 18, height 5\' 7"  (1.702 m), weight 200 lb (90.7 kg), SpO2 95 %. PHYSICAL EXAMINATION:  Physical Exam  Constitutional: He is oriented to person, place, and time. He appears well-developed.  Obesity.  HENT:  Head: Normocephalic.  Mouth/Throat: Oropharynx is clear and moist.  Eyes: Pupils are equal, round, and reactive to light. Conjunctivae and EOM are normal. No scleral icterus.  Neck: Normal range of motion. Neck supple. No JVD present. No tracheal deviation present.  Cardiovascular: Normal rate, regular rhythm and normal heart sounds. Exam reveals no gallop.  No murmur heard. Pulmonary/Chest: Effort normal. No stridor. No respiratory distress. He has wheezes. He has no rales. He exhibits no tenderness.  Abdominal: Soft. Bowel sounds are normal. He exhibits no distension. There is tenderness. There is no rebound.  Tenderness on epigastric area  Musculoskeletal: Normal range of motion. He exhibits no edema or tenderness.  Neurological: He is alert and oriented to person, place, and time. No cranial nerve deficit.  Skin: No rash noted. No erythema.  Psychiatric: He has a normal mood and affect.   LABORATORY PANEL:  Male CBC Recent Labs  Lab 07/14/18 0523  WBC 8.6  HGB 10.4*  HCT 31.5*  PLT 253   ------------------------------------------------------------------------------------------------------------------ Chemistries  Recent Labs  Lab 07/13/18 2135 07/14/18 0523  NA 136 137  K 3.5 3.7  CL 102 105  CO2 25 25  GLUCOSE 126* 120*  BUN 18 11  CREATININE 1.25* 1.05  CALCIUM 9.1 8.5*  AST 31  --   ALT 24  --   ALKPHOS 58  --   BILITOT 0.6  --    RADIOLOGY:  No results found. ASSESSMENT AND PLAN:  Sepsis due to acute diverticulitis. Continue Rocephin and Flagyl, follow-up blood culture is negative so far. Pain control.   Possible acute bronchitis.  Start DuoNeb every 6 hours.  Continue IV Rocephin. GERD.  Continue PPI. HTN.   Continue Norvasc.  All the records are reviewed and case discussed with Care Management/Social Worker. Management plans discussed with the patient, family and they are in agreement.  CODE STATUS: Full Code  TOTAL TIME TAKING CARE OF THIS PATIENT: 28 minutes.   More than 50% of the time was spent in counseling/coordination of care: YES  POSSIBLE D/C IN 2 DAYS, DEPENDING ON CLINICAL CONDITION.   Shaune Pollack M.D on 07/15/2018 at 1:27 PM  Between 7am to 6pm - Pager - [phone]  After 6pm go to [email or web address] - Therapist, nutritional Hospitalists

## 2018-07-15 NOTE — Progress Notes (Signed)
While in the process of administering patient's antibiotic, patient informed this nurse that he'd taken a shower. This nurse informed patient that he needs a doctor's order for a shower and that peripheral IV sites need to be protected when showering. Patient verbalized understanding. IV dressing was changed prior to administering antibiotic, and IV line was flushed with 0.9% Sodium Chloride. Will continue to monitor IV site.

## 2018-07-16 MED ORDER — AMLODIPINE BESYLATE 10 MG PO TABS
10.0000 mg | ORAL_TABLET | Freq: Every day | ORAL | Status: DC
  Administered 2018-07-16: 10 mg via ORAL
  Filled 2018-07-16: qty 1

## 2018-07-16 MED ORDER — AMLODIPINE BESYLATE 10 MG PO TABS
10.0000 mg | ORAL_TABLET | Freq: Every day | ORAL | 0 refills | Status: AC
Start: 2018-07-16 — End: ?

## 2018-07-16 MED ORDER — METRONIDAZOLE 500 MG PO TABS
500.0000 mg | ORAL_TABLET | Freq: Three times a day (TID) | ORAL | Status: DC
  Administered 2018-07-16: 500 mg via ORAL
  Filled 2018-07-16: qty 1

## 2018-07-16 MED ORDER — CEPHALEXIN 500 MG PO CAPS: 500.0000 mg | ORAL_CAPSULE | Freq: Three times a day (TID) | ORAL | 0 refills | Status: DC

## 2018-07-16 MED ORDER — CEFDINIR 300 MG PO CAPS
300.0000 mg | ORAL_CAPSULE | Freq: Two times a day (BID) | ORAL | Status: DC
  Administered 2018-07-16: 10:00:00 300 mg via ORAL
  Filled 2018-07-16 (×2): qty 1

## 2018-07-16 MED ORDER — CEFDINIR 300 MG PO CAPS: 300.0000 mg | ORAL_CAPSULE | Freq: Two times a day (BID) | ORAL | 0 refills | Status: AC

## 2018-07-16 MED ORDER — GUAIFENESIN-DM 100-10 MG/5ML PO SYRP: 5.0000 mL | ORAL_SOLUTION | ORAL | 0 refills | Status: AC | PRN

## 2018-07-16 MED ORDER — ALBUTEROL SULFATE HFA 108 (90 BASE) MCG/ACT IN AERS: 1.0000 | INHALATION_SPRAY | Freq: Four times a day (QID) | RESPIRATORY_TRACT | 2 refills | Status: AC | PRN

## 2018-07-16 MED ORDER — IPRATROPIUM-ALBUTEROL 0.5-2.5 (3) MG/3ML IN SOLN: 3.0000 mL | Freq: Four times a day (QID) | RESPIRATORY_TRACT | Status: DC | PRN

## 2018-07-16 MED ORDER — METRONIDAZOLE 500 MG PO TABS: 500.0000 mg | ORAL_TABLET | Freq: Three times a day (TID) | ORAL | 0 refills | Status: AC

## 2018-07-16 MED ORDER — CEPHALEXIN 500 MG PO CAPS: 500.0000 mg | ORAL_CAPSULE | Freq: Three times a day (TID) | ORAL | Status: DC

## 2018-07-16 NOTE — Progress Notes (Signed)
Discharge instructions reviewed with patient. Questions answered. Prescriptions and follow up discussed. Patient denied further questions at this time. No IV in place at time of d/c. Awaiting MD work note prior to d/c. Physician paged.

## 2018-07-16 NOTE — Discharge Summary (Signed)
[facility] - [place] at [facility]   PATIENT NAME: Keith NickelRichard Goodman    MR#:  [id number]  DATE OF BIRTH:  May 28, 1966  DATE OF ADMISSION:  07/13/2018   ADMITTING PHYSICIAN: Oralia Manisavid Willis, MD  DATE OF DISCHARGE:07/16/2018 PRIMARY CARE PHYSICIAN: System, Pcp Not In   ADMISSION DIAGNOSIS:  Diverticulitis [K57.92] Sepsis, due to unspecified organism (HCC) [A41.9] DISCHARGE DIAGNOSIS:  Principal Problem:   Sepsis (HCC) Active Problems:   Diverticulitis   HTN (hypertension)   GERD (gastroesophageal reflux disease)  SECONDARY DIAGNOSIS:   Past Medical History:  Diagnosis Date  . Hypertension    HOSPITAL COURSE:  Sepsis due to acute diverticulitis. He has been treated wtih Rocephin and Flagyl, follow-up blood culture is negative so far. Symptoms have improved. Change to omnicef and flagyl po for 7 more days.  Acute bronchitis.  Started DuoNeb every 6 hours. Albuterol prn. Robitussin prn. GERD.  Continue PPI. HTN.  Continue Norvasc. DISCHARGE CONDITIONS:  Stable, discharge to home today. CONSULTS OBTAINED:   DRUG ALLERGIES:   Allergies  Allergen Reactions  . Levofloxacin Hives  . Penicillins Palpitations    Has patient had a PCN reaction causing immediate rash, facial/tongue/throat swelling, SOB or lightheadedness with hypotension: Yes Has patient had a PCN reaction causing severe rash involving mucus membranes or skin necrosis: No Has patient had a PCN reaction that required hospitalization: No Has patient had a PCN reaction occurring within the last 10 years: Yes If all of the above answers are "NO", then may proceed with Cephalosporin use.   DISCHARGE MEDICATIONS:   Allergies as of 07/16/2018      Reactions   Levofloxacin Hives   Penicillins Palpitations   Has patient had a PCN reaction causing immediate rash, facial/tongue/throat swelling, SOB or lightheadedness with hypotension: Yes Has patient had a PCN reaction causing severe rash involving  mucus membranes or skin necrosis: No Has patient had a PCN reaction that required hospitalization: No Has patient had a PCN reaction occurring within the last 10 years: Yes If all of the above answers are "NO", then may proceed with Cephalosporin use.      Medication List    TAKE these medications   acidophilus Caps capsule Take 1 capsule by mouth daily.   albuterol 108 (90 Base) MCG/ACT inhaler Commonly known as:  PROVENTIL HFA;VENTOLIN HFA Inhale 1-2 puffs into the lungs every 6 (six) hours as needed for wheezing or shortness of breath.   amLODipine 10 MG tablet Commonly known as:  NORVASC Take 1 tablet (10 mg total) by mouth daily. What changed:    medication strength  how much to take   cefdinir 300 MG capsule Commonly known as:  OMNICEF Take 1 capsule (300 mg total) by mouth every 12 (twelve) hours.   fexofenadine 180 MG tablet Commonly known as:  ALLEGRA Take 180 mg by mouth daily.   GUAIFENESIN 1200 PO Take 1 tablet by mouth daily.   guaiFENesin-dextromethorphan 100-10 MG/5ML syrup Commonly known as:  ROBITUSSIN DM Take 5 mLs by mouth every 4 (four) hours as needed for cough (chest congestion).   lansoprazole 15 MG capsule Commonly known as:  PREVACID Take 15 mg by mouth daily at 12 noon.   metroNIDAZOLE 500 MG tablet Commonly known as:  FLAGYL Take 1 tablet (500 mg total) by mouth every 8 (eight) hours.   PARoxetine 40 MG tablet Commonly known as:  PAXIL Take 40 mg by mouth every morning.   ranitidine 150 MG tablet Commonly known as:  ZANTAC  Take 150 mg by mouth 2 (two) times daily.        DISCHARGE INSTRUCTIONS:  See AVS. If you experience worsening of your admission symptoms, develop shortness of breath, life threatening emergency, suicidal or homicidal thoughts you must seek medical attention immediately by calling 911 or calling your MD immediately  if symptoms less severe.  You Must read complete instructions/literature along with all the  possible adverse reactions/side effects for all the Medicines you take and that have been prescribed to you. Take any new Medicines after you have completely understood and accpet all the possible adverse reactions/side effects.   Please note  You were cared for by a hospitalist during your hospital stay. If you have any questions about your discharge medications or the care you received while you were in the hospital after you are discharged, you can call the unit and asked to speak with the hospitalist on call if the hospitalist that took care of you is not available. Once you are discharged, your primary care physician will handle any further medical issues. Please note that NO REFILLS for any discharge medications will be authorized once you are discharged, as it is imperative that you return to your primary care physician (or establish a relationship with a primary care physician if you do not have one) for your aftercare needs so that they can reassess your need for medications and monitor your lab values.    On the day of Discharge:  VITAL SIGNS:  Blood pressure (!) 162/98, pulse 81, temperature 98.7 F (37.1 C), temperature source Oral, resp. rate 18, height 5\' 7"  (1.702 m), weight 200 lb (90.7 kg), SpO2 97 %. PHYSICAL EXAMINATION:  GENERAL:  52 y.o.-year-old patient lying in the bed with no acute distress.  EYES: Pupils equal, round, reactive to light and accommodation. No scleral icterus. Extraocular muscles intact.  HEENT: Head atraumatic, normocephalic. Oropharynx and nasopharynx clear.  NECK:  Supple, no jugular venous distention. No thyroid enlargement, no tenderness.  LUNGS: Normal breath sounds bilaterally, no wheezing, rales,rhonchi or crepitation. No use of accessory muscles of respiration.  CARDIOVASCULAR: S1, S2 normal. No murmurs, rubs, or gallops.  ABDOMEN: Soft, non-tender, non-distended. Bowel sounds present. No organomegaly or mass.  EXTREMITIES: No pedal edema, cyanosis,  or clubbing.  NEUROLOGIC: Cranial nerves II through XII are intact. Muscle strength 5/5 in all extremities. Sensation intact. Gait not checked.  PSYCHIATRIC: The patient is alert and oriented x 3.  SKIN: No obvious rash, lesion, or ulcer.  DATA REVIEW:   CBC Recent Labs  Lab 07/14/18 0523  WBC 8.6  HGB 10.4*  HCT 31.5*  PLT 253    Chemistries  Recent Labs  Lab 07/13/18 2135 07/14/18 0523  NA 136 137  K 3.5 3.7  CL 102 105  CO2 25 25  GLUCOSE 126* 120*  BUN 18 11  CREATININE 1.25* 1.05  CALCIUM 9.1 8.5*  AST 31  --   ALT 24  --   ALKPHOS 58  --   BILITOT 0.6  --      Microbiology Results  Results for orders placed or performed during the hospital encounter of 07/13/18  Blood Culture (routine x 2)     Status: None (Preliminary result)   Collection Time: 07/13/18  9:44 PM  Result Value Ref Range Status   Specimen Description BLOOD BLOOD LEFT FOREARM  Final   Special Requests   Final    Blood Culture results may not be optimal due to an excessive volume of  blood received in culture bottles   Culture   Final    NO GROWTH 3 DAYS Performed at [facility], [address] [place], [place] [address]    Report Status PENDING  Incomplete  Blood Culture (routine x 2)     Status: None (Preliminary result)   Collection Time: 07/13/18 10:04 PM  Result Value Ref Range Status   Specimen Description BLOOD BLOOD LEFT FOREARM  Final   Special Requests   Final    BOTTLES DRAWN AEROBIC AND ANAEROBIC Blood Culture results may not be optimal due to an inadequate volume of blood received in culture bottles   Culture   Final    NO GROWTH 3 DAYS Performed at [facility], [address], [place], [place] [address]    Report Status PENDING  Incomplete    RADIOLOGY:  No results found.   Management plans discussed with the patient, his wife and they are in agreement.  CODE STATUS: Full Code   TOTAL TIME TAKING CARE OF THIS PATIENT: 36 minutes.    Shaune Pollack M.D on 07/16/2018 at 11:06 AM  Between 7am to 6pm - Pager - [phone]  After 6pm go to [email or web address] - Social research officer, government  [facility] [place] [facility]  Office  [phone]  CC: Primary care physician; System, Pcp Not In   Note: This dictation was prepared with Dragon dictation along with smaller phrase technology. Any transcriptional errors that result from this process are unintentional.

## 2018-07-16 NOTE — Progress Notes (Signed)
[facility] - [place] at [facility]        Keith Goodman was admitted to the Hospital on 07/13/2018 and Discharged  07/16/2018 and should be excused from work/school   for 10  days starting 07/13/2018 , may return to work/school without any restrictions. His wife is with him these days and should be excused from work/school.  Keith PollackQing Sindhu Goodman M.D on 07/16/2018,at 11:03 AM  [facility] - [place] [facility]    Office  [phone]

## 2018-07-18 ENCOUNTER — Telehealth: Payer: Self-pay

## 2018-07-18 LAB — CULTURE, BLOOD (ROUTINE X 2)
Culture: NO GROWTH
Culture: NO GROWTH

## 2018-07-18 NOTE — Telephone Encounter (Signed)
EMMI Follow-up: Received a voice message from Mr. Keith Goodman as he had just missed a call from my number.  I called Mr. Keith Goodman and explained the reason for the automated call.  Said he had his Rx's filled, follow-up appointments made and was taking it one day at time.  He wanted to thank everyone for the wonderful care he received here and stated everyone was very kind to him. This is a 1st rate hospital~ I thanked him for the compliments and let him know I was glad he was re-cooperatring well. I let him know there would be a 2nd automated call with a different series of questions and to let us know if he had any concerns at that time.

## 2018-07-22 ENCOUNTER — Telehealth: Payer: Self-pay

## 2018-07-22 NOTE — Telephone Encounter (Signed)
2nd EMMI Follow-up: Noted on the report that the patient had questions about his discharge papers and follow-up appointment.  I talked with Mr. Keith Goodman and he was home in [place] now and had one more day of antiboitics to take. Said he was feeling a lot better and said we had very nice staff here. Checking to see if he who he should follow-up with since no information was listed on his discharge paperwork and I let him know he should contact his PCP there in [place].  He thanked me for following up with him. No other needs noted.

## 2019-06-14 IMAGING — CT CT ABD-PELV W/ CM
2 of 5 series · 15 of 46 positions shown, 17 images · IV contrast (APPLIED)
Comparison: None.

CLINICAL DATA: Left upper quadrant and chest pain radiating to the
back since [REDACTED] morning. History of inguinal hernia repair.

EXAM:
CT ABDOMEN AND PELVIS WITH CONTRAST
TECHNIQUE: Multidetector CT imaging of the abdomen and pelvis was performed
using the standard protocol following bolus administration of
intravenous contrast.
CONTRAST:  100mL OMNIPAQUE IOHEXOL 300 MG/ML  SOLN

[Series 2: routine abd/pel with · axial · 0.76mm/px · z∈[-1162,-727]mm · 12 of 99 slices shown, 14 images]
[im 6/99  soft-tissue]
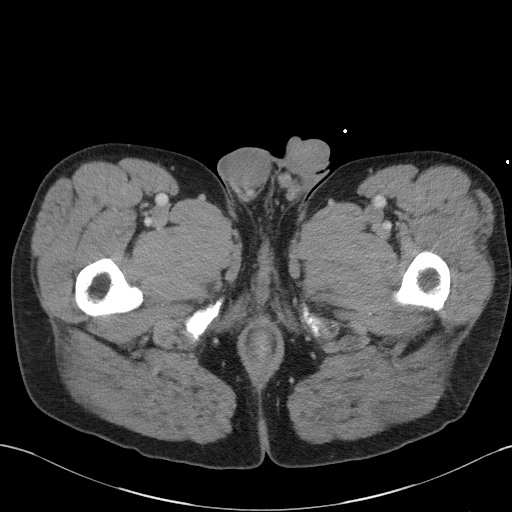
[im 6/99  bone]
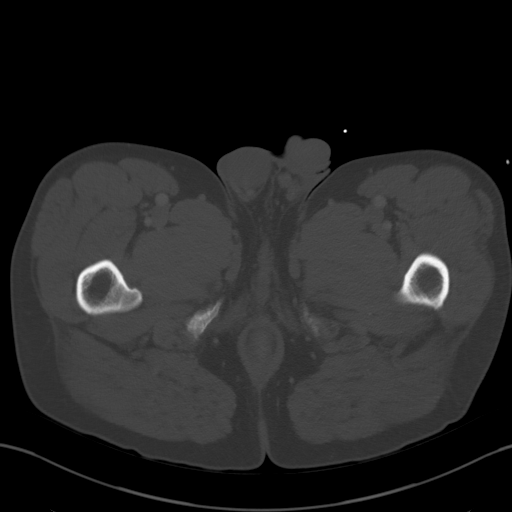
[im 16/99  soft-tissue]
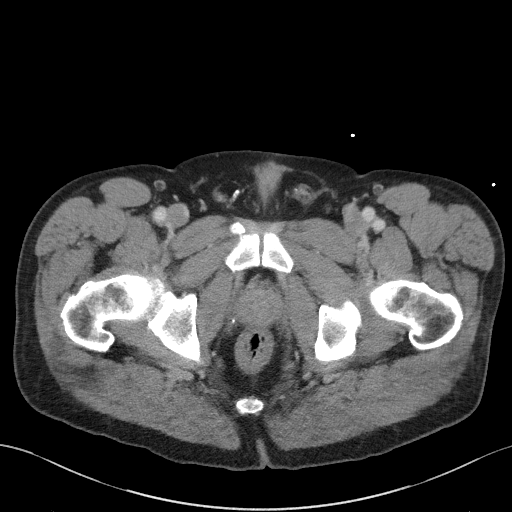
[im 21/99  soft-tissue]
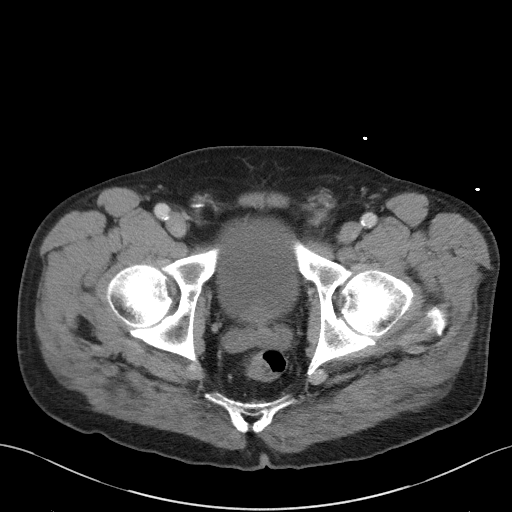
[im 31/99  soft-tissue]
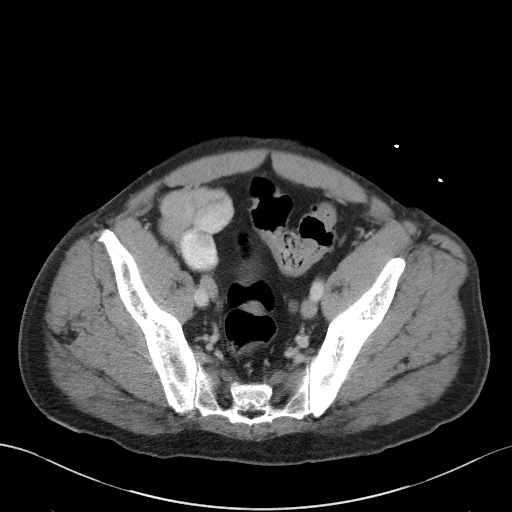
[im 37/99  soft-tissue]
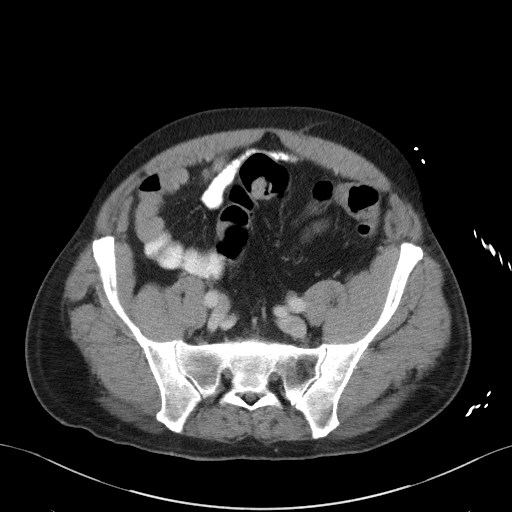
[im 47/99  soft-tissue]
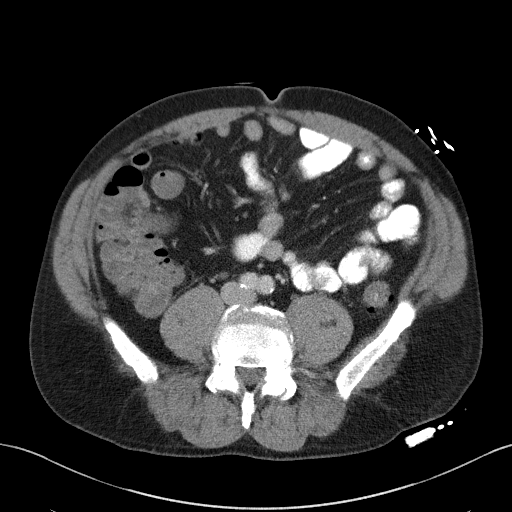
[im 52/99  soft-tissue]
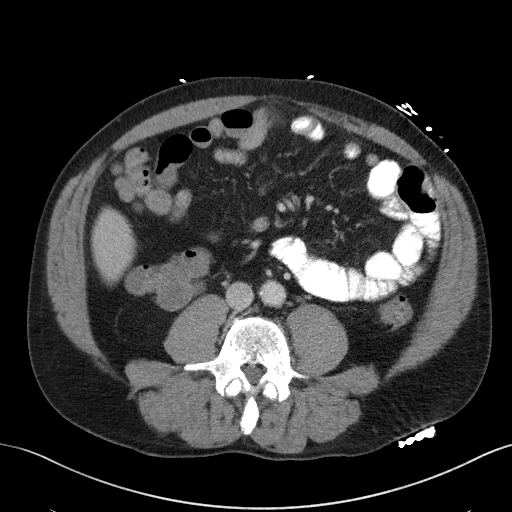
[im 62/99  soft-tissue]
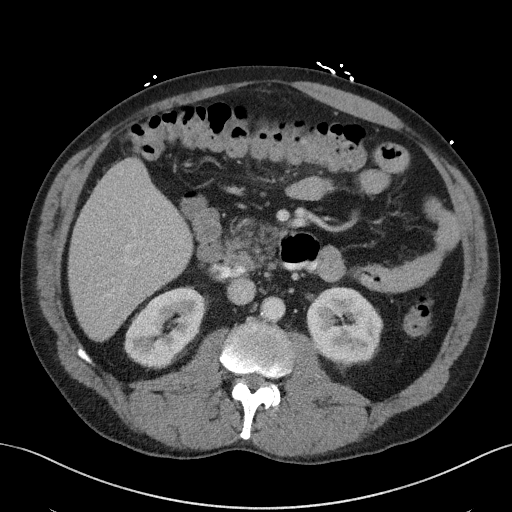
[im 68/99  soft-tissue]
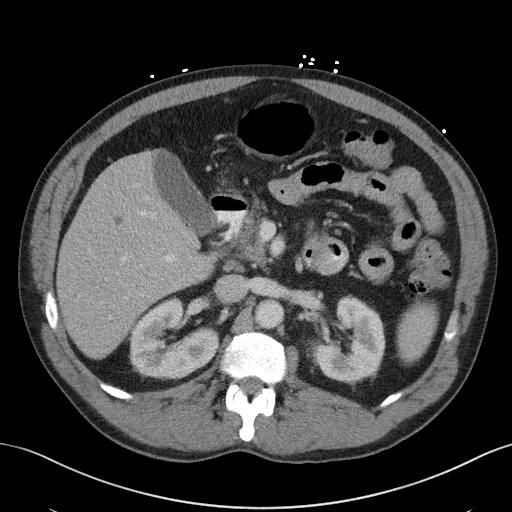
[im 68/99  bone]
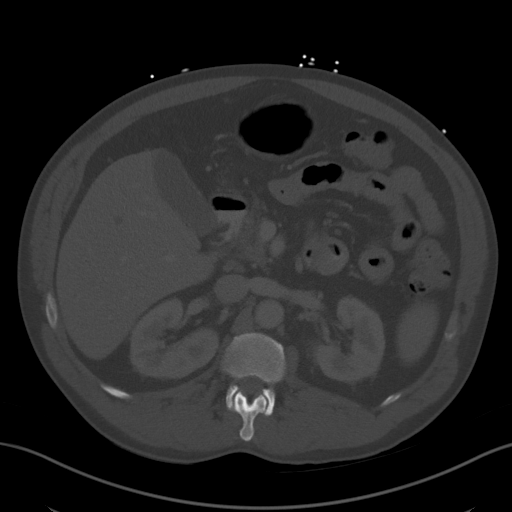
[im 78/99  soft-tissue]
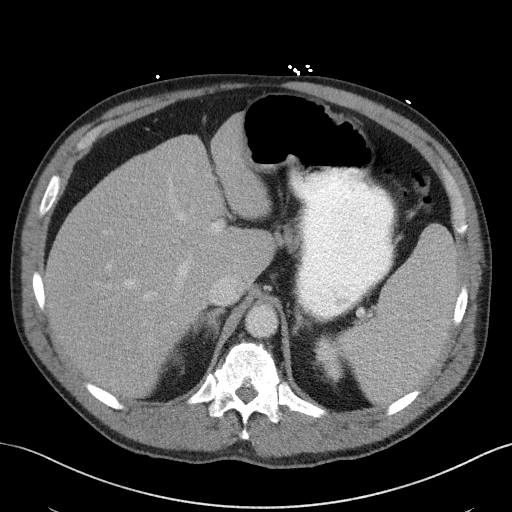
[im 83/99  soft-tissue]
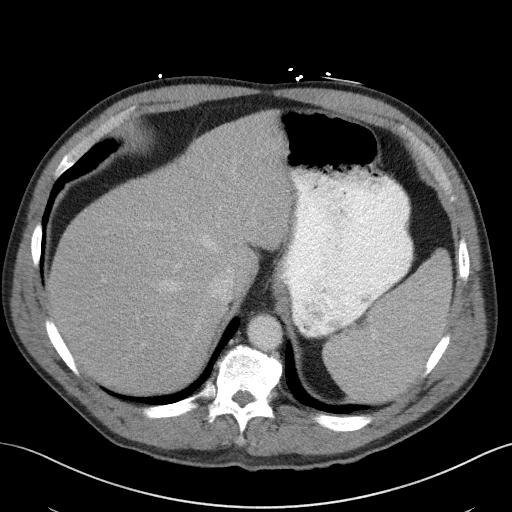
[im 93/99  soft-tissue]
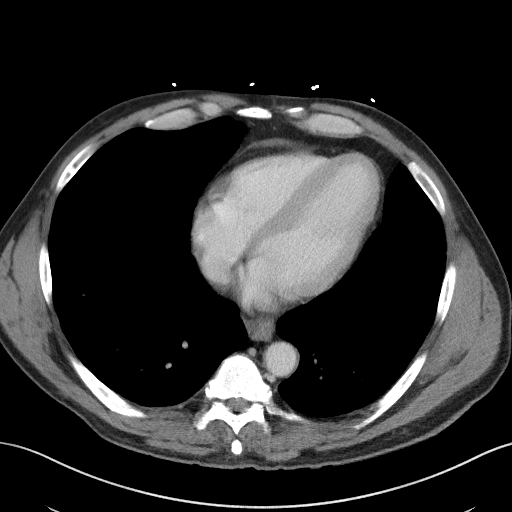

[Series 5: coronal st · coronal · 0.74mm/px · 3 of 102 slices shown]
[im 34/102  soft-tissue]
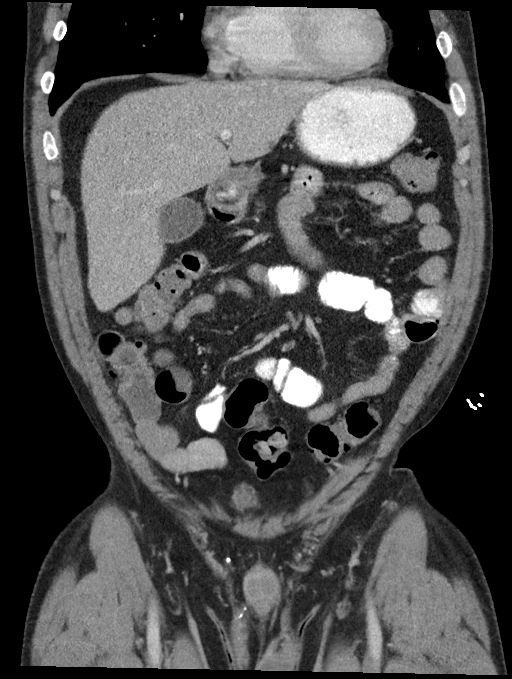
[im 45/102  soft-tissue]
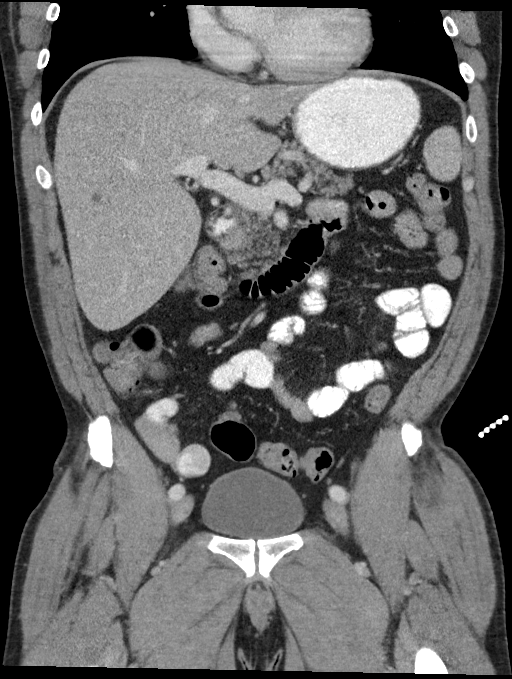
[im 57/102  soft-tissue]
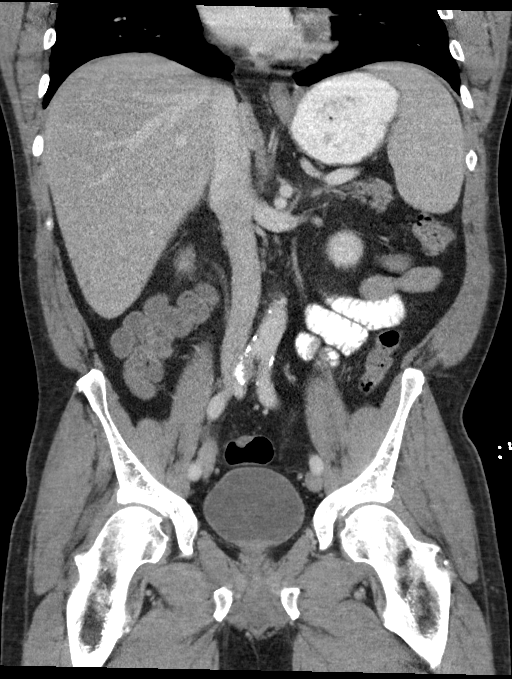

[15 of 46 positions shown; findings below may reference images not displayed]

FINDINGS: Lower chest: No acute abnormality.

Hepatobiliary: A few scattered subcentimeter hypodensities are noted
within the liver too small to further characterize but statistically
consistent with cysts or hemangiomata. No biliary dilatation or
enhancing lesion. Gallbladder is physiologically distended. No
calculi are noted within.

Pancreas: No pancreatic inflammation, mass or ductal dilatation.
Mild fatty atrophy.

Spleen: No splenomegaly or mass.

Adrenals/Urinary Tract: Normal bilateral adrenal glands.
Nonobstructing calculi in the upper pole of the right kidney
measuring up to 3 mm. No enhancing mass lesions of either kidney. No
hydroureteronephrosis nor ureteral calculi. The urinary bladder is
unremarkable for the degree of distention.

Stomach/Bowel: Short segmental mural thickening and pericolonic
inflammation along the mid transverse colon. Findings could
represent a mild diverticulitis or focal colitis. Interval follow-up
after appropriate therapy is recommended to assure resolution and to
exclude other etiologies including neoplasm. No bowel obstruction or
inflammation. Contrast distended stomach without focal mural
thickening. Normal appendix.

Vascular/Lymphatic: Mild aortoiliac atherosclerosis.  No adenopathy.

Reproductive: Normal size prostate and seminal vesicles.

Other: Herniorrhaphy change bilaterally. No recurrent hernia
identified.

Musculoskeletal: Degenerative disc disease L5-S1 with L4-5 and L5-S1
facet arthropathy.
IMPRESSION: 1. Short segmental mural thickening of the mid transverse colon with
pericolonic inflammation. Findings likely reflect stigmata of
uncomplicated diverticulitis or short segment colitis. Follow-up to
assure resolution is recommended.
2. A few scattered subcentimeter hypodensities are noted within the
liver too small to further characterize but statistically consistent
with cysts or hemangiomata.
3. Degenerative disc disease L5-S1 with lower lumbar facet
arthropathy.
